# Patient Record
Sex: Male | Born: 1990 | Race: White | Hispanic: No | Marital: Married | Smoking: Never smoker
Health system: Southern US, Community
[De-identification: ages and names within clinical notes are randomized; demographics above are authoritative.]

## PROBLEM LIST (undated history)

## (undated) HISTORY — PX: TONSILLECTOMY: SUR1361

---

## 2015-07-02 ENCOUNTER — Emergency Department (HOSPITAL_COMMUNITY): Payer: Managed Care, Other (non HMO)

## 2015-07-02 ENCOUNTER — Encounter (HOSPITAL_COMMUNITY): Payer: Self-pay | Admitting: Emergency Medicine

## 2015-07-02 ENCOUNTER — Emergency Department (HOSPITAL_COMMUNITY)
Admission: EM | Admit: 2015-07-02 | Discharge: 2015-07-02 | Disposition: A | Discharge status: Home / Self Care | Payer: Managed Care, Other (non HMO) | Attending: Emergency Medicine | Admitting: Emergency Medicine

## 2015-07-02 DIAGNOSIS — K625 Hemorrhage of anus and rectum: Secondary | ICD-10-CM | POA: Diagnosis not present

## 2015-07-02 DIAGNOSIS — K529 Noninfective gastroenteritis and colitis, unspecified: Secondary | ICD-10-CM | POA: Insufficient documentation

## 2015-07-02 DIAGNOSIS — R1013 Epigastric pain: Secondary | ICD-10-CM | POA: Diagnosis present

## 2015-07-02 LAB — CBC
HCT: 46.7 % (ref 39.0–52.0)
Hemoglobin: 15.9 g/dL (ref 13.0–17.0)
MCH: 31.1 pg (ref 26.0–34.0)
MCHC: 34 g/dL (ref 30.0–36.0)
MCV: 91.2 fL (ref 78.0–100.0)
PLATELETS: 252 10*3/uL (ref 150–400)
RBC: 5.12 MIL/uL (ref 4.22–5.81)
RDW: 12.4 % (ref 11.5–15.5)
WBC: 10.5 10*3/uL (ref 4.0–10.5)

## 2015-07-02 LAB — COMPREHENSIVE METABOLIC PANEL
ALK PHOS: 105 U/L (ref 38–126)
ALT: 38 U/L (ref 17–63)
AST: 29 U/L (ref 15–41)
Albumin: 5 g/dL (ref 3.5–5.0)
Anion gap: 11 (ref 5–15)
BILIRUBIN TOTAL: 1.7 mg/dL — AB (ref 0.3–1.2)
BUN: 12 mg/dL (ref 6–20)
CALCIUM: 9.6 mg/dL (ref 8.9–10.3)
CO2: 26 mmol/L (ref 22–32)
CREATININE: 0.97 mg/dL (ref 0.61–1.24)
Chloride: 105 mmol/L (ref 101–111)
GFR calc non Af Amer: >60 mL/min (ref >60)
Glucose, Bld: 185 mg/dL — ABNORMAL HIGH (ref 65–99)
Potassium: 3.6 mmol/L (ref 3.5–5.1)
SODIUM: 142 mmol/L (ref 135–145)
TOTAL PROTEIN: 8.2 g/dL — AB (ref 6.5–8.1)

## 2015-07-02 LAB — POC OCCULT BLOOD, ED: FECAL OCCULT BLD: POSITIVE — AB

## 2015-07-02 LAB — I-STAT CG4 LACTIC ACID, ED: Lactic Acid, Venous: 1.78 mmol/L (ref 0.5–2.0)

## 2015-07-02 LAB — LIPASE, BLOOD: Lipase: 17 U/L — ABNORMAL LOW (ref 22–51)

## 2015-07-02 MED ORDER — METRONIDAZOLE 500 MG PO TABS
500.0000 mg | ORAL_TABLET | Freq: Once | ORAL | Status: AC
  Administered 2015-07-02: 500 mg via ORAL
  Filled 2015-07-02: qty 1

## 2015-07-02 MED ORDER — METRONIDAZOLE 500 MG PO TABS: 500.0000 mg | ORAL_TABLET | Freq: Three times a day (TID) | ORAL | Status: AC

## 2015-07-02 MED ORDER — CIPROFLOXACIN HCL 500 MG PO TABS: 500.0000 mg | ORAL_TABLET | Freq: Two times a day (BID) | ORAL | Status: AC

## 2015-07-02 MED ORDER — PANTOPRAZOLE SODIUM 40 MG PO TBEC
40.0000 mg | DELAYED_RELEASE_TABLET | Freq: Once | ORAL | Status: AC
  Administered 2015-07-02: 40 mg via ORAL
  Filled 2015-07-02: qty 1

## 2015-07-02 MED ORDER — IOHEXOL 300 MG/ML  SOLN
100.0000 mL | Freq: Once | INTRAMUSCULAR | Status: AC | PRN
  Administered 2015-07-02: 100 mL via INTRAVENOUS

## 2015-07-02 MED ORDER — CIPROFLOXACIN HCL 500 MG PO TABS
500.0000 mg | ORAL_TABLET | Freq: Once | ORAL | Status: AC
  Administered 2015-07-02: 500 mg via ORAL
  Filled 2015-07-02: qty 1

## 2015-07-02 MED ORDER — GI COCKTAIL ~~LOC~~
30.0000 mL | Freq: Once | ORAL | Status: AC
  Administered 2015-07-02: 30 mL via ORAL
  Filled 2015-07-02: qty 30

## 2015-07-02 MED ORDER — SUCRALFATE 1 G PO TABS
1.0000 g | ORAL_TABLET | Freq: Once | ORAL | Status: AC
  Administered 2015-07-02: 1 g via ORAL
  Filled 2015-07-02: qty 1

## 2015-07-02 NOTE — ED Notes (Signed)
Per pt, states abdominal pain which started last night-states then had a soft stool with some blood-then had 4 bloody BMs-

## 2015-07-02 NOTE — ED Provider Notes (Signed)
CSN: [id number]     Arrival date & time 07/02/15  1330 History   First MD Initiated Contact with Patient 07/02/15 1507     Chief Complaint  Patient presents with  . Abdominal Pain  . Rectal Bleeding     (Consider location/radiation/quality/duration/timing/severity/associated sxs/prior Treatment) Patient is a 24 y.o. male presenting with abdominal pain and hematochezia. The history is provided by the patient.  Abdominal Pain Pain location:  Epigastric, LUQ and RUQ Pain quality: cramping and sharp   Pain radiates to:  Back Pain severity:  Moderate Onset quality:  Gradual Duration:  16 hours Timing:  Constant Progression:  Waxing and waning Chronicity:  New Context: recent illness (recent dental work, has been taking 1600 mg of ibuprofen daily for the past few weeks. Stopped 2 days ago)   Context: not alcohol use, not laxative use and not recent travel   Relieved by:  Nothing Worsened by:  Nothing tried Associated symptoms: hematochezia (4 episodes of BRBPR without rectal pain beginning last night)   Associated symptoms: no chest pain, no chills, no shortness of breath and no vomiting   Risk factors: NSAID use   Rectal Bleeding Quality:  Bright red Amount:  Moderate Duration:  16 hours Timing:  Constant Progression:  Unchanged Chronicity:  New Context: defecation and spontaneously   Context: not anal penetration, not constipation and not rectal pain   Relieved by:  Nothing Worsened by:  Nothing tried Associated symptoms: abdominal pain   Associated symptoms: no vomiting     History reviewed. No pertinent past medical history. Past Surgical History  Procedure Laterality Date  . Tonsillectomy     No family history on file. Social History  Substance Use Topics  . Smoking status: Never Smoker   . Smokeless tobacco: None  . Alcohol Use: Yes    Review of Systems  Constitutional: Negative for chills.  Respiratory: Negative for shortness of breath.   Cardiovascular:  Negative for chest pain.  Gastrointestinal: Positive for abdominal pain and hematochezia (4 episodes of BRBPR without rectal pain beginning last night). Negative for vomiting.  All other systems reviewed and are negative.     Allergies  Review of patient's allergies indicates no known allergies.  Home Medications   Prior to Admission medications   Not on File   BP 170/94 mmHg  Pulse 84  Resp 20  SpO2 99% Physical Exam  Constitutional: He is oriented to person, place, and time. He appears well-developed and well-nourished. No distress.  HENT:  Head: Normocephalic and atraumatic.  Mouth/Throat: No oropharyngeal exudate.  Eyes: EOM are normal. Pupils are equal, round, and reactive to light.  Neck: Normal range of motion. Neck supple.  Cardiovascular: Normal rate and regular rhythm.  Exam reveals no friction rub.   No murmur heard. Pulmonary/Chest: Effort normal and breath sounds normal. No respiratory distress. He has no wheezes. He has no rales.  Abdominal: He exhibits no distension and no mass. There is tenderness (mild, diffuse upper abdomen, no peritoneal signs). There is no rebound and no guarding.  Musculoskeletal: Normal range of motion. He exhibits no edema.  Neurological: He is alert and oriented to person, place, and time.  Skin: He is not diaphoretic.  Nursing note and vitals reviewed.   ED Course  Procedures (including critical care time) Labs Review Labs Reviewed  COMPREHENSIVE METABOLIC PANEL - Abnormal; Notable for the following:    Glucose, Bld 185 (*)    Total Protein 8.2 (*)    Total Bilirubin 1.7 (*)  All other components within normal limits  CBC  POC OCCULT BLOOD, ED  I-STAT CG4 LACTIC ACID, ED    Imaging Review Ct Abdomen Pelvis W Contrast  07/02/2015   CLINICAL DATA:  Acute onset of generalized abdominal pain and bright red blood in stool. Initial encounter.  EXAM: CT ABDOMEN AND PELVIS WITH CONTRAST  TECHNIQUE: Multidetector CT imaging of the  abdomen and pelvis was performed using the standard protocol following bolus administration of intravenous contrast.  CONTRAST:  OMNIPAQUE IOHEXOL 300 MG/ML  SOLN  COMPARISON:  None.  FINDINGS: The visualized lung bases are clear.  The liver and spleen are unremarkable in appearance. The gallbladder is within normal limits. The pancreas and adrenal glands are unremarkable.  The kidneys are unremarkable in appearance. There is no evidence of hydronephrosis. No renal or ureteral stones are seen. No perinephric stranding is appreciated.  No free fluid is identified. The small bowel is unremarkable in appearance. The stomach is within normal limits. No acute vascular abnormalities are seen.  The appendix is normal in caliber, without evidence of appendicitis.  There is suggestion of mild diffuse mucosal thickening and minimal soft tissue stranding along the descending colon, concerning for mild acute infectious or inflammatory colitis. There is no definite evidence of ischemia. The remainder of the colon is unremarkable in appearance.  The bladder is mildly distended and grossly unremarkable. A small urachal remnant is incidentally seen. No inguinal lymphadenopathy is seen.  No acute osseous abnormalities are identified.  IMPRESSION: Suggestion of mild diffuse mucosal thickening and minimal soft tissue stranding along the descending colon, concerning for mild acute infectious or inflammatory colitis. Remainder of the colon is unremarkable.   Electronically Signed   By: Roanna Raider M.D.   On: 07/02/2015 19:59   I have personally reviewed and evaluated these images and lab results as part of my medical decision-making.   EKG Interpretation None      MDM   Final diagnoses:  Colitis  Rectal bleeding    69M here with rectal bleeding. Began last night. Has been on 1600 mg of ibuprofen daily for the past 2 weeks after some dental work. Patient had some upper abdominal sharp pains that radiated down to  his lower abdomen. No vomiting or fever. Had 4 episodes of painless BRBPR today. No dizziness, SOB, CP.  On exam, vitals stable. Mild upper abdominal pain, no lower abdominal pain. I suspect this is related to gastritis from his high dose NSAID use over the past 2 weeks. He is young, healthy, and otherwise stable. Will get labs, give carafate, PPI, and a GI cocktail. He had another bloody bowel movement here. Labs okay. CT shows colitis, given Cipro Flagyl. Stable for discharge.   Elwin Mocha, MD 07/02/15 2251

## 2015-07-02 NOTE — ED Notes (Signed)
Bed: [facility] Expected date:  Expected time:  Means of arrival:  Comments: 

## 2015-07-02 NOTE — ED Notes (Signed)
Bed: [facility]10 Expected date:  Expected time:  Means of arrival:  Comments: Hold for [place] D

## 2015-07-02 NOTE — Discharge Instructions (Signed)

## 2017-03-03 IMAGING — CT CT ABD-PELV W/ CM
2 of 4 series · 16 of 46 positions shown, 18 images · IV contrast (OMNIPAQUE 300)
Comparison: None.

CLINICAL DATA: Acute onset of generalized abdominal pain and bright
red blood in stool. Initial encounter.

EXAM:
CT ABDOMEN AND PELVIS WITH CONTRAST
TECHNIQUE: Multidetector CT imaging of the abdomen and pelvis was performed
using the standard protocol following bolus administration of
intravenous contrast.
CONTRAST:  100mL OMNIPAQUE IOHEXOL 300 MG/ML  SOLN

[Series 2: abd/pel with · axial · 0.74mm/px · z∈[-251,+179]mm · 13 of 94 slices shown, 15 images]
[im 4/94  soft-tissue]
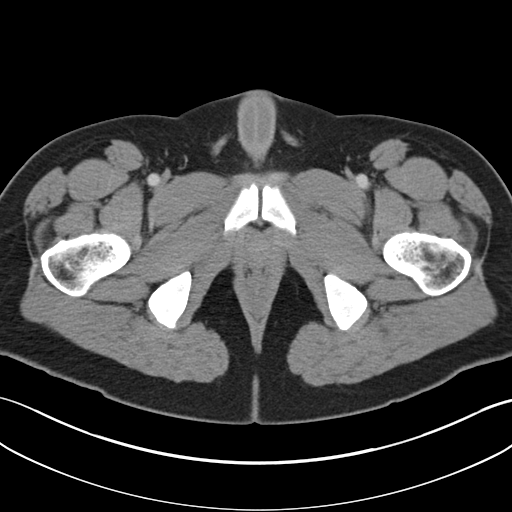
[im 4/94  bone]
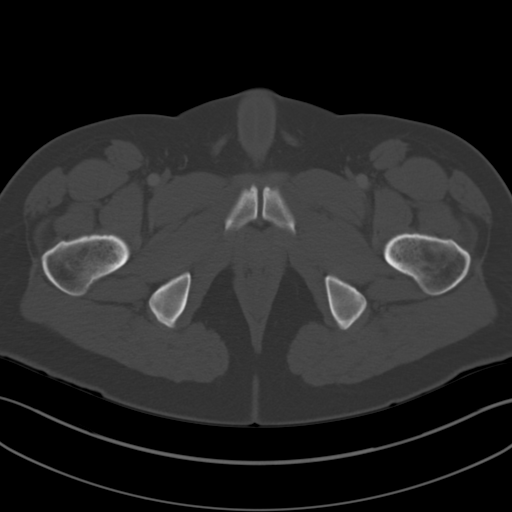
[im 12/94  soft-tissue]
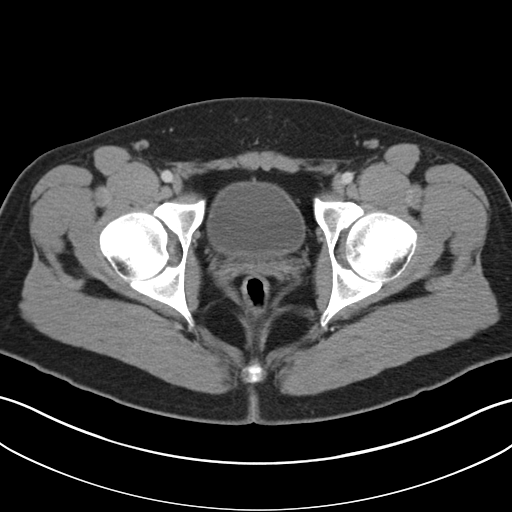
[im 20/94  soft-tissue]
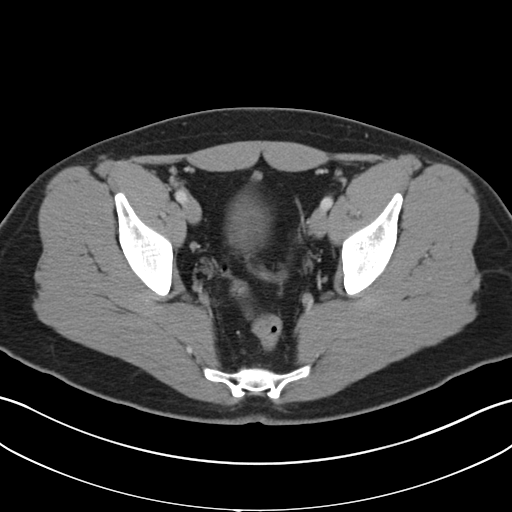
[im 28/94  soft-tissue]
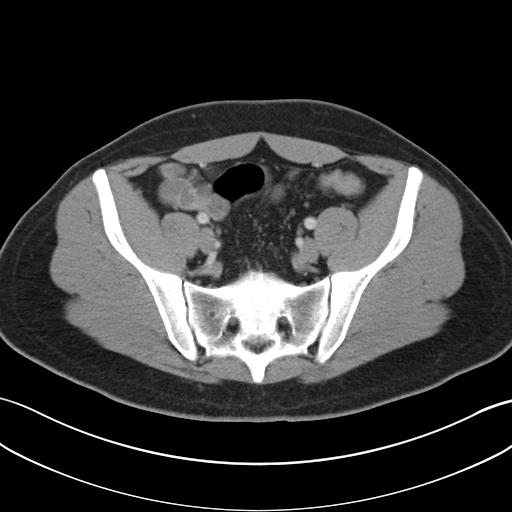
[im 32/94  soft-tissue]
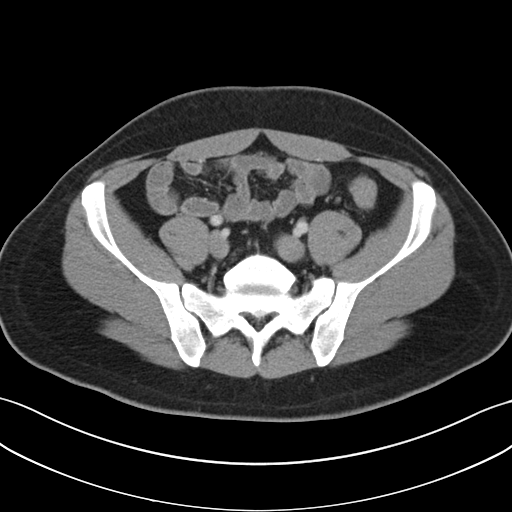
[im 39/94  soft-tissue]
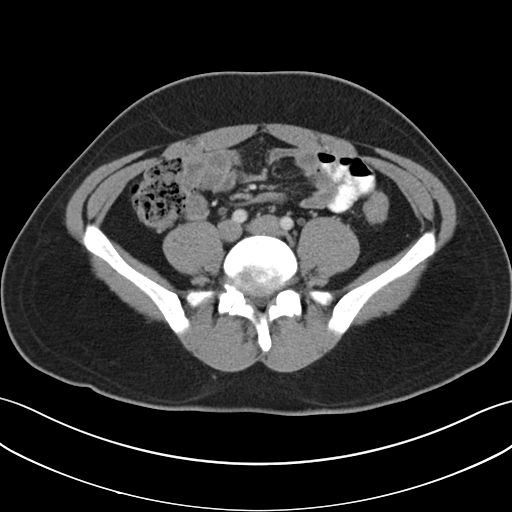
[im 47/94  soft-tissue]
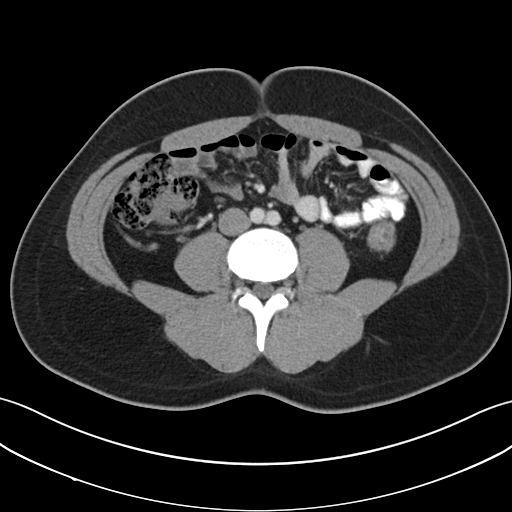
[im 55/94  soft-tissue]
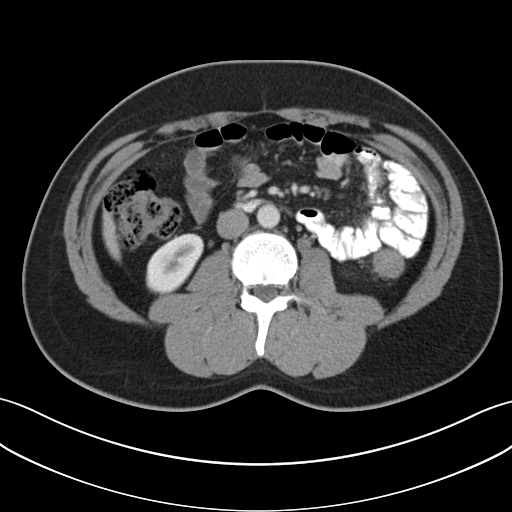
[im 63/94  soft-tissue]
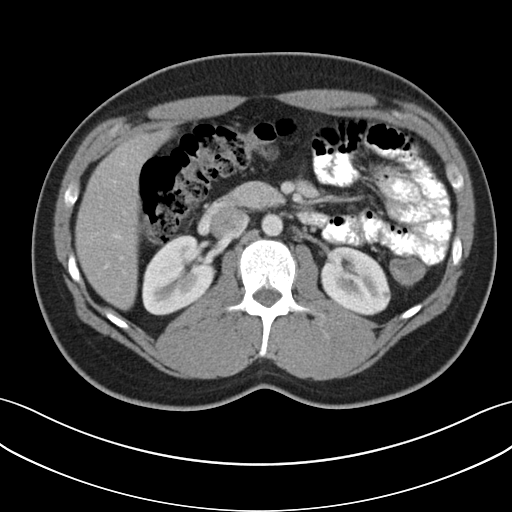
[im 63/94  bone]
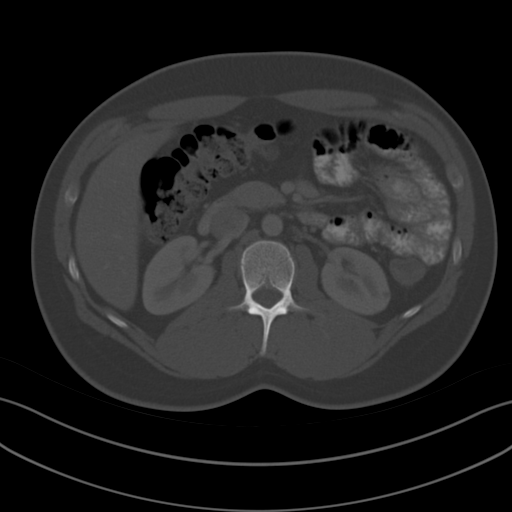
[im 66/94  soft-tissue]
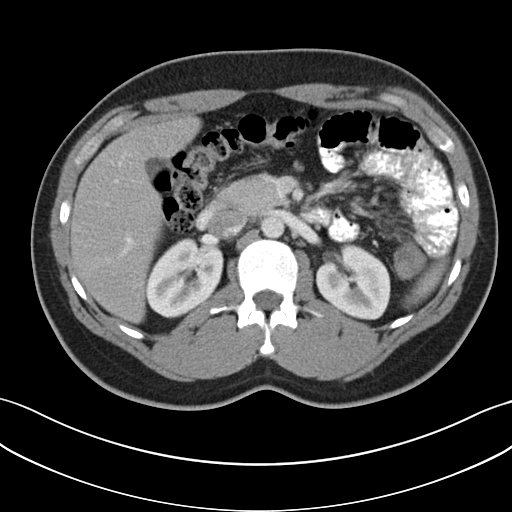
[im 74/94  soft-tissue]
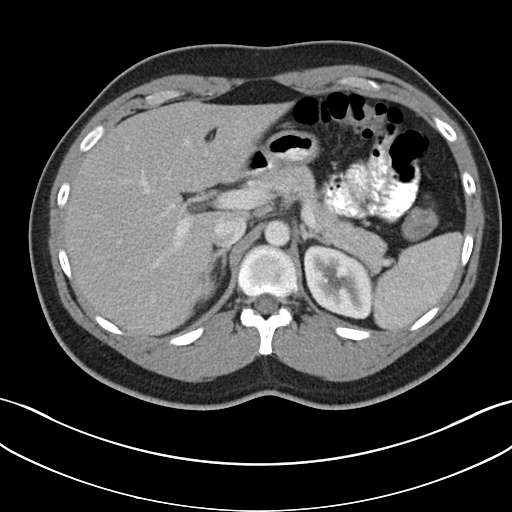
[im 82/94  soft-tissue]
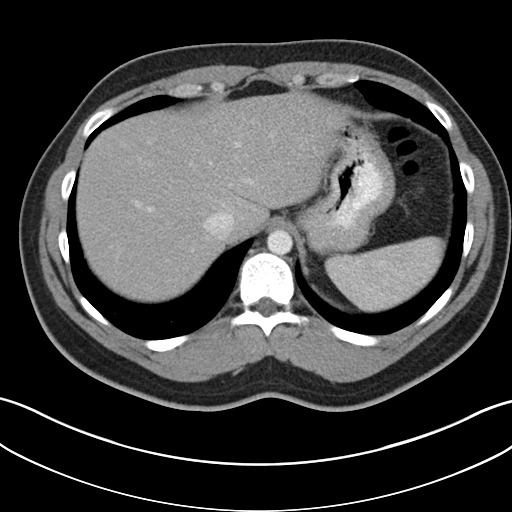
[im 90/94  soft-tissue]
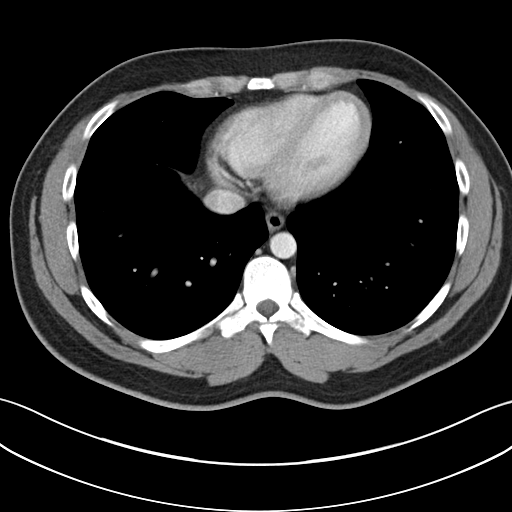

[Series 4: coronal a/|p · coronal · 0.74mm/px · 3 of 98 slices shown]
[im 33/98  soft-tissue]
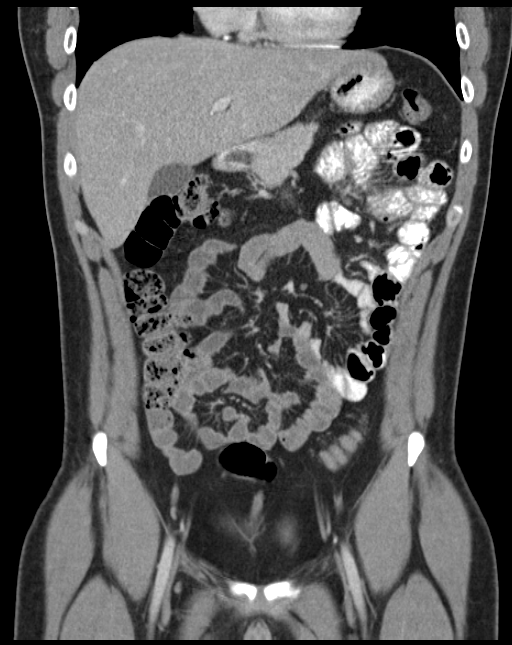
[im 44/98  soft-tissue]
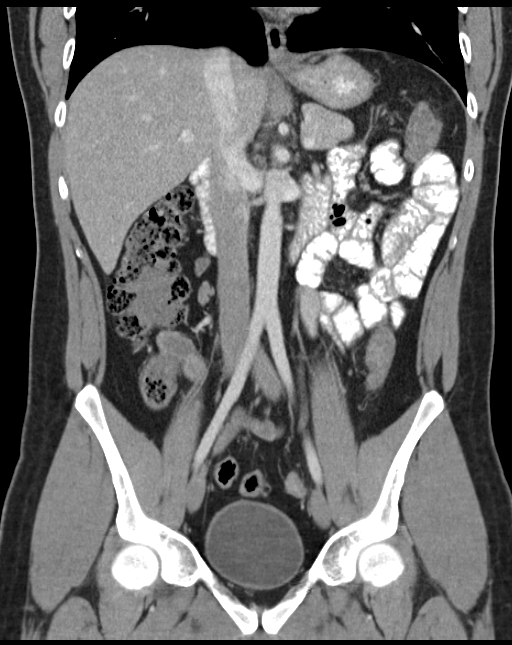
[im 54/98  soft-tissue]
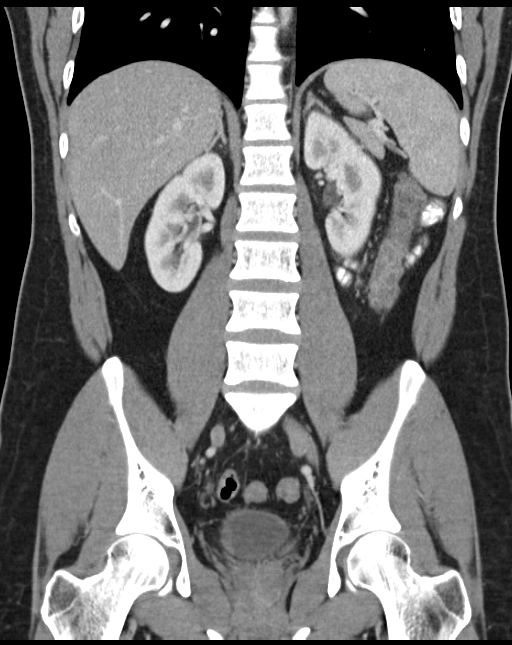

[16 of 46 positions shown; findings below may reference images not displayed]

FINDINGS: The visualized lung bases are clear.

The liver and spleen are unremarkable in appearance. The gallbladder
is within normal limits. The pancreas and adrenal glands are
unremarkable.

The kidneys are unremarkable in appearance. There is no evidence of
hydronephrosis. No renal or ureteral stones are seen. No perinephric
stranding is appreciated.

No free fluid is identified. The small bowel is unremarkable in
appearance. The stomach is within normal limits. No acute vascular
abnormalities are seen.

The appendix is normal in caliber, without evidence of appendicitis.

There is suggestion of mild diffuse mucosal thickening and minimal
soft tissue stranding along the descending colon, concerning for
mild acute infectious or inflammatory colitis. There is no definite
evidence of ischemia. The remainder of the colon is unremarkable in
appearance.

The bladder is mildly distended and grossly unremarkable. A small
urachal remnant is incidentally seen. No inguinal lymphadenopathy is
seen.

No acute osseous abnormalities are identified.
IMPRESSION: Suggestion of mild diffuse mucosal thickening and minimal soft
tissue stranding along the descending colon, concerning for mild
acute infectious or inflammatory colitis. Remainder of the colon is
unremarkable.
